# Patient Record
Sex: Female | Born: 1990 | Race: Black or African American | Hispanic: No | Marital: Single | Smoking: Current every day smoker
Health system: Southern US, Community
[De-identification: ages and names within clinical notes are randomized; demographics above are authoritative.]

## PROBLEM LIST (undated history)

## (undated) DIAGNOSIS — J45909 Unspecified asthma, uncomplicated: Secondary | ICD-10-CM

---
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
## 2008-05-13 IMAGING — CT CT ABDOMEN W/O CM
2 of 4 series · 13 of 32 positions shown, 18 images · IV contrast (agent unspecified)
Comparison: None.

CLINICAL DATA: Vomiting with back pain.  Question kidney stone. 
ABDOMEN CT WITHOUT CONTRAST:
TECHNIQUE: Multidetector CT imaging of the abdomen was performed following the standard protocol without IV contrast.
TECHNIQUE: Multidetector CT imaging of the pelvis was performed following the standard protocol without IV contrast.

[Series 2: routine abdomen · axial · 0.70mm/px · z∈[-365,-85]mm · 5 of 84 slices shown, 10 images]
[im 14/84  soft-tissue]
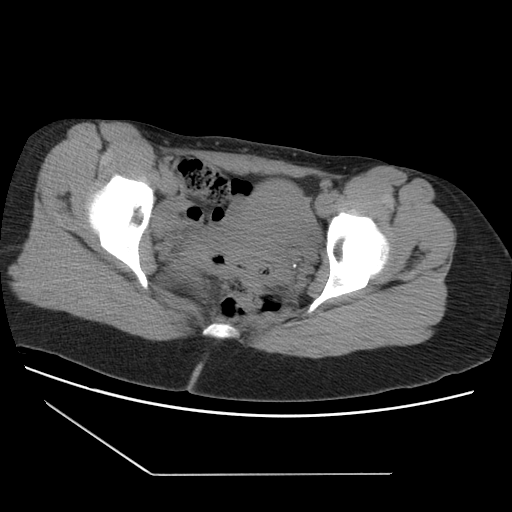
[im 14/84  bone]
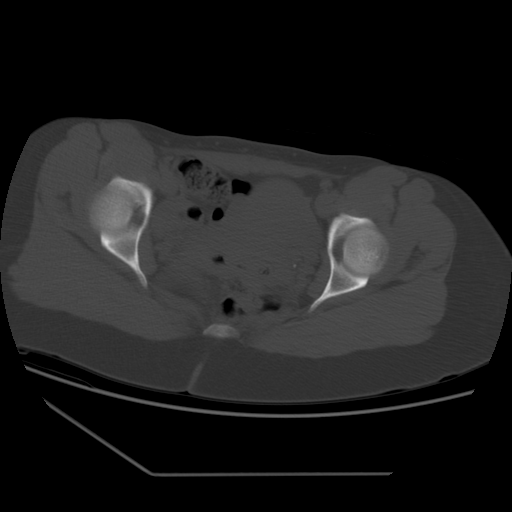
[im 28/84  soft-tissue]
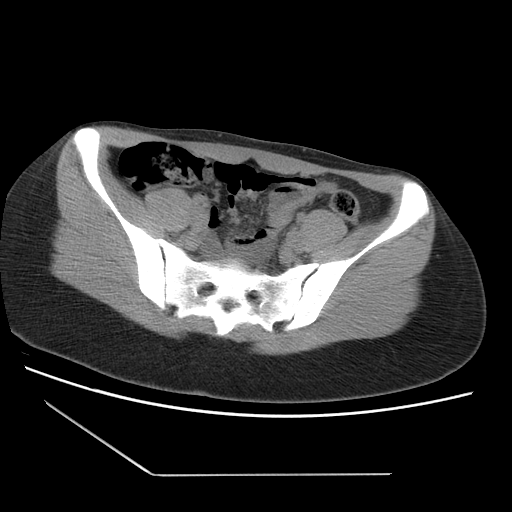
[im 28/84  lung]
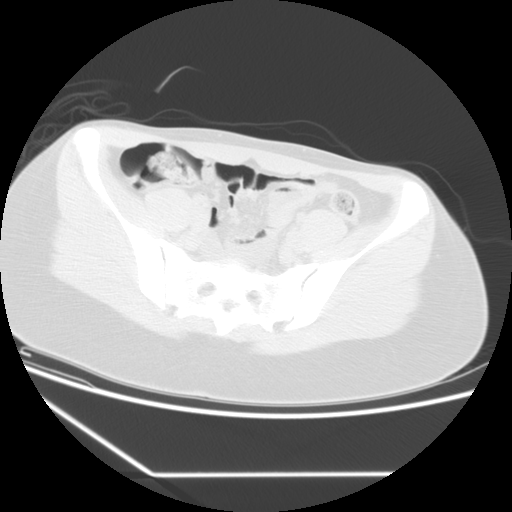
[im 42/84  soft-tissue]
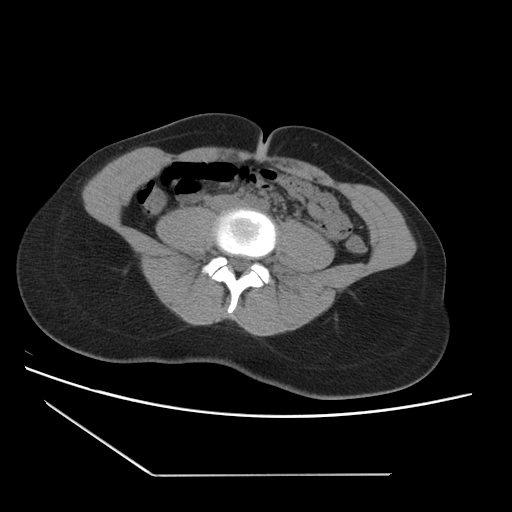
[im 42/84  lung]
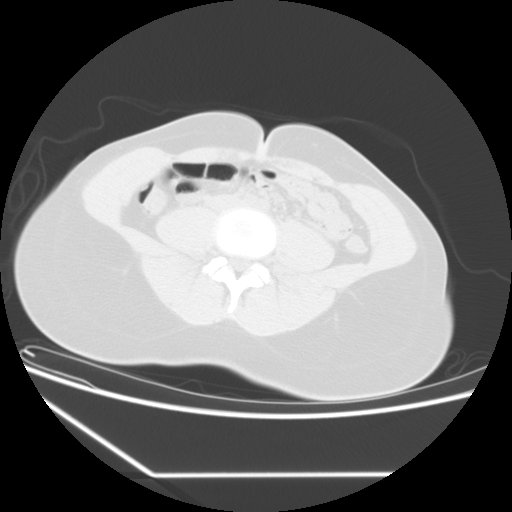
[im 56/84  soft-tissue]
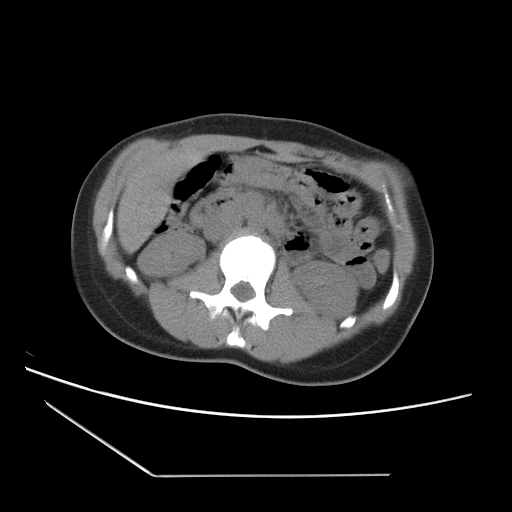
[im 56/84  lung]
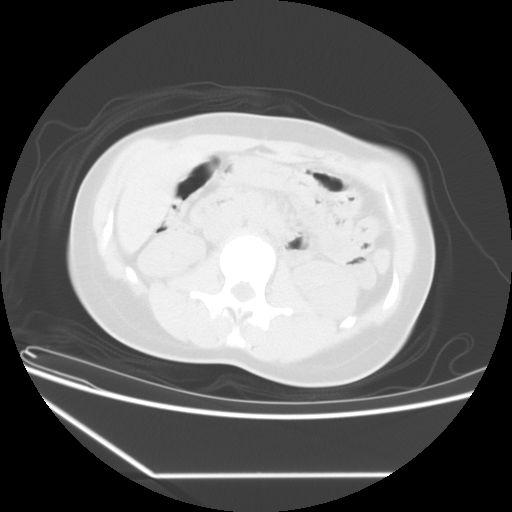
[im 70/84  soft-tissue]
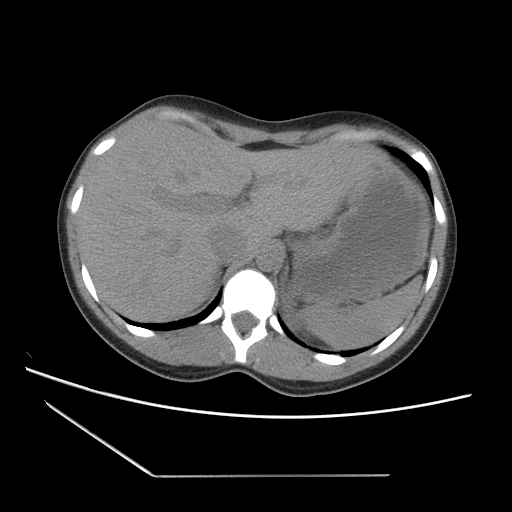
[im 70/84  lung]
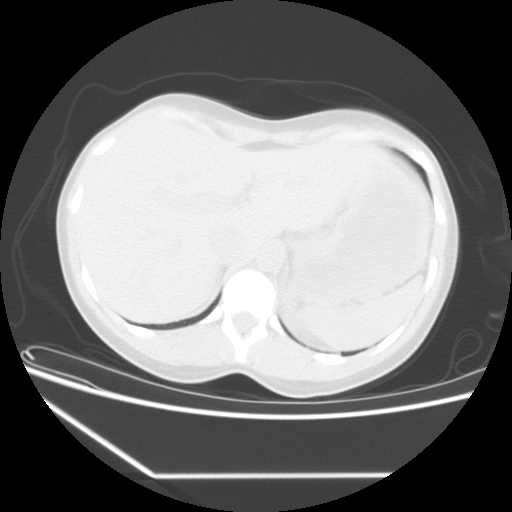

[Series 400: reformatted · sagittal · 0.88mm/px · 8 of 148 slices shown]
[im 13/148  soft-tissue]
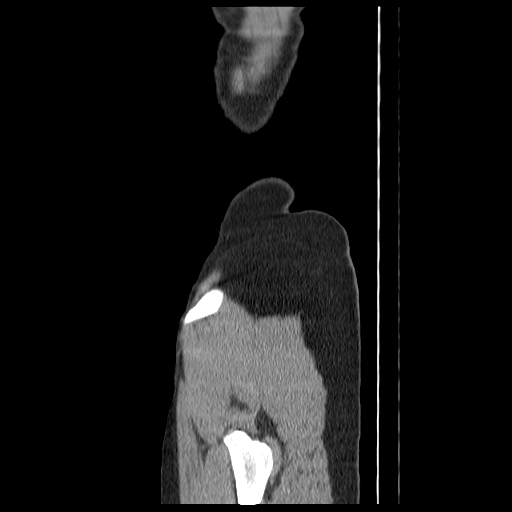
[im 37/148  soft-tissue]
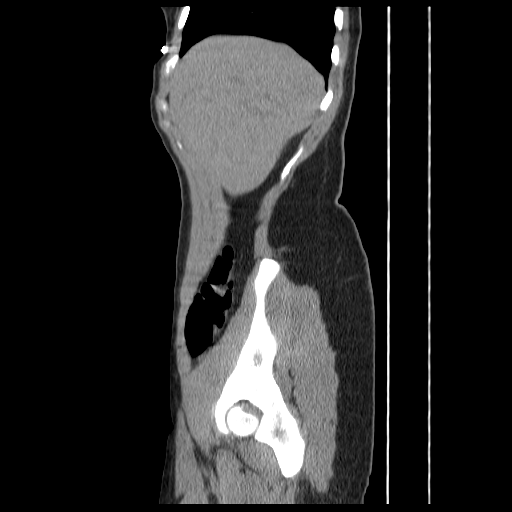
[im 50/148  soft-tissue]
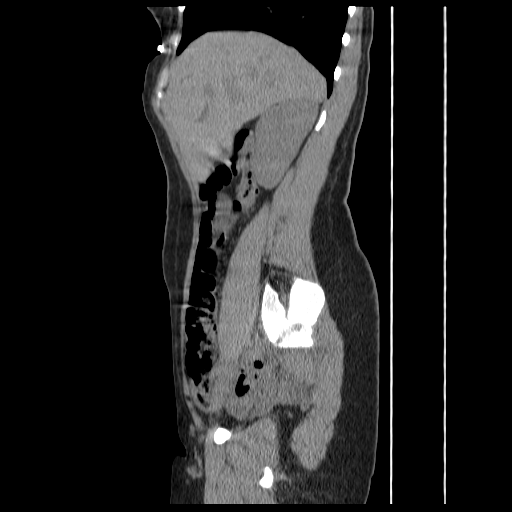
[im 62/148  soft-tissue]
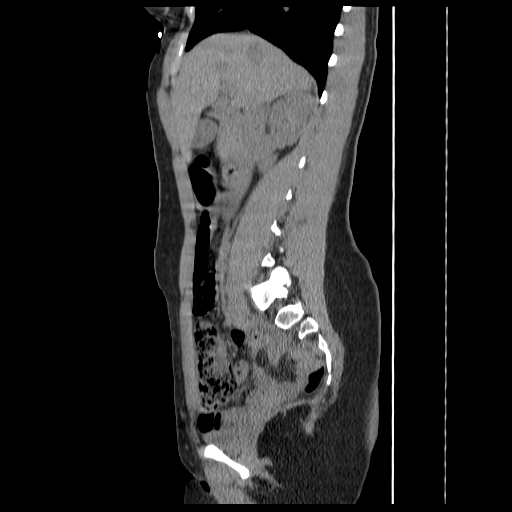
[im 86/148  soft-tissue]
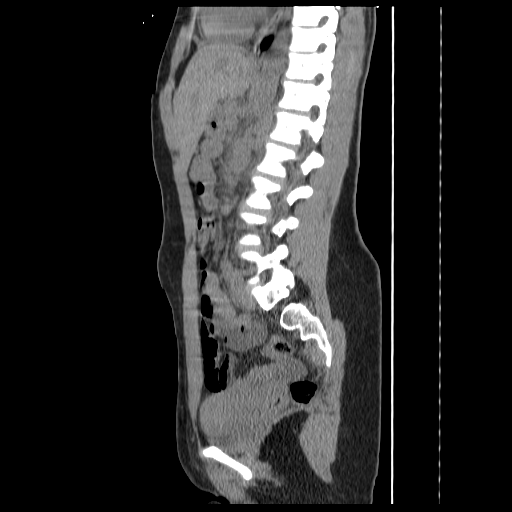
[im 99/148  soft-tissue]
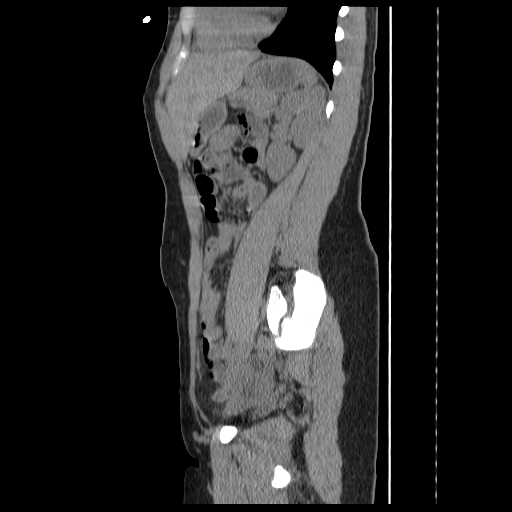
[im 111/148  soft-tissue]
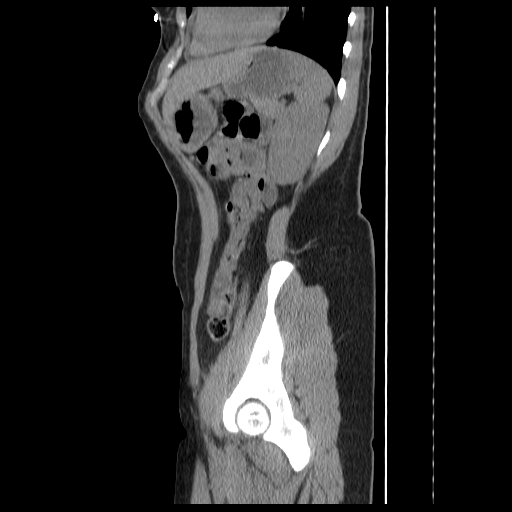
[im 135/148  soft-tissue]
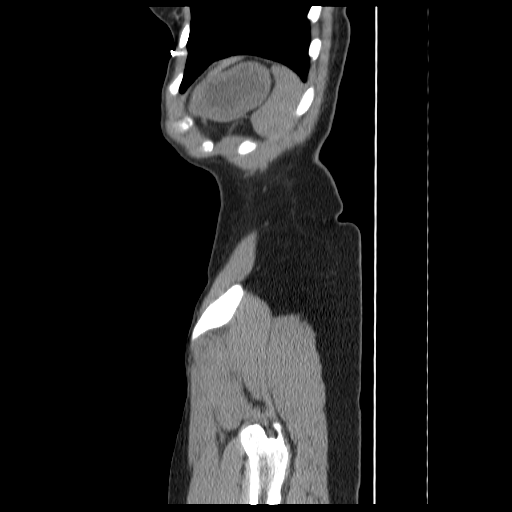

[13 of 32 positions shown; findings below may reference images not displayed]

FINDINGS: The lung bases are clear.  There is no pleural effusion.  There is a pectus deformity of the chest.  No renal or ureteral calculi are demonstrated.  There is no hydronephrosis.  As imaged in the unenhanced state, the liver, spleen, gallbladder, pancreas, adrenal glands, and kidneys appear unremarkable, but are not optimally evaluated due to limited intraabdominal fat and lack of contrast.
IMPRESSION: Negative for urinary tract calculus or hydronephrosis.  No acute abdominal findings are seen. 
PELVIS CT WITHOUT CONTRAST:
FINDINGS: Distally, the ureters are difficult to visualize.  No ureteral or bladder calculi are seen.  There are bilateral pelvic calcifications which are probably all phleboliths.  Air is noted within the vagina.  There is no adnexal mass.
IMPRESSION: No evidence of urinary tract calculus or hydronephrosis.  Air in the vagina ? correlate clinically.

## 2010-11-03 LAB — WET PREP, GENITAL
Trich, Wet Prep: NONE SEEN
Yeast Wet Prep HPF POC: NONE SEEN

## 2010-11-03 LAB — RAPID STREP SCREEN (MED CTR MEBANE ONLY): Streptococcus, Group A Screen (Direct): NEGATIVE

## 2010-11-03 LAB — GC/CHLAMYDIA PROBE AMP, GENITAL
Chlamydia, DNA Probe: POSITIVE — AB
GC Probe Amp, Genital: NEGATIVE

## 2010-11-03 LAB — POCT URINALYSIS DIP (DEVICE)
Glucose, UA: NEGATIVE
Hgb urine dipstick: NEGATIVE
Ketones, ur: 40 — AB
Nitrite: NEGATIVE
Operator id: 116391
Protein, ur: 30 — AB
Specific Gravity, Urine: 1.025
Urobilinogen, UA: 0.2
pH: 5.5

## 2010-11-03 LAB — POCT PREGNANCY, URINE
Operator id: 116391
Preg Test, Ur: POSITIVE

## 2010-11-24 LAB — I-STAT 8, (EC8 V) (CONVERTED LAB)
Chloride: 105
HCT: 46
Hemoglobin: 15.6
Potassium: 3.2 — ABNORMAL LOW
Sodium: 142
TCO2: 27
pH, Ven: 7.368 — ABNORMAL HIGH

## 2010-11-24 LAB — POCT I-STAT CREATININE
Creatinine, Ser: 0.8
Operator id: 151321

## 2010-11-24 LAB — URINALYSIS, ROUTINE W REFLEX MICROSCOPIC
Glucose, UA: NEGATIVE
Protein, ur: NEGATIVE
Urobilinogen, UA: 1

## 2010-11-24 LAB — URINE MICROSCOPIC-ADD ON

## 2010-11-24 LAB — DIFFERENTIAL
Basophils Absolute: 0.1
Basophils Relative: 1
Lymphocytes Relative: 30
Monocytes Absolute: 0.7
Monocytes Relative: 7
Neutro Abs: 6.5
Neutrophils Relative %: 61

## 2010-11-24 LAB — CBC
Hemoglobin: 13.7
RBC: 4.5
RDW: 12.9

## 2010-11-24 LAB — POCT PREGNANCY, URINE: Operator id: 151321

## 2017-04-22 ENCOUNTER — Encounter (HOSPITAL_COMMUNITY): Payer: Self-pay | Admitting: Emergency Medicine

## 2017-04-22 ENCOUNTER — Emergency Department (HOSPITAL_COMMUNITY)
Admission: EM | Admit: 2017-04-22 | Discharge: 2017-04-22 | Disposition: A | Discharge status: Home / Self Care | Payer: Medicaid Other | Attending: Emergency Medicine | Admitting: Emergency Medicine

## 2017-04-22 ENCOUNTER — Other Ambulatory Visit: Payer: Self-pay

## 2017-04-22 DIAGNOSIS — Y998 Other external cause status: Secondary | ICD-10-CM | POA: Insufficient documentation

## 2017-04-22 DIAGNOSIS — M25562 Pain in left knee: Secondary | ICD-10-CM | POA: Insufficient documentation

## 2017-04-22 DIAGNOSIS — Y9389 Activity, other specified: Secondary | ICD-10-CM | POA: Insufficient documentation

## 2017-04-22 DIAGNOSIS — F1210 Cannabis abuse, uncomplicated: Secondary | ICD-10-CM | POA: Insufficient documentation

## 2017-04-22 DIAGNOSIS — F17210 Nicotine dependence, cigarettes, uncomplicated: Secondary | ICD-10-CM | POA: Insufficient documentation

## 2017-04-22 DIAGNOSIS — J45909 Unspecified asthma, uncomplicated: Secondary | ICD-10-CM | POA: Diagnosis not present

## 2017-04-22 DIAGNOSIS — Y92410 Unspecified street and highway as the place of occurrence of the external cause: Secondary | ICD-10-CM | POA: Diagnosis not present

## 2017-04-22 DIAGNOSIS — R52 Pain, unspecified: Secondary | ICD-10-CM | POA: Diagnosis not present

## 2017-04-22 DIAGNOSIS — M549 Dorsalgia, unspecified: Secondary | ICD-10-CM | POA: Diagnosis not present

## 2017-04-22 DIAGNOSIS — Z5321 Procedure and treatment not carried out due to patient leaving prior to being seen by health care provider: Secondary | ICD-10-CM | POA: Diagnosis not present

## 2017-04-22 HISTORY — DX: Unspecified asthma, uncomplicated: J45.909

## 2017-04-22 MED ORDER — MELOXICAM 15 MG PO TABS: 15.0000 mg | ORAL_TABLET | Freq: Every day | ORAL | 0 refills | Status: AC

## 2017-04-22 MED ORDER — BACLOFEN 10 MG PO TABS: 10.0000 mg | ORAL_TABLET | Freq: Three times a day (TID) | ORAL | 0 refills | Status: AC

## 2017-04-22 NOTE — ED Notes (Signed)
Pt stable, ambulatory, and verbalizes understanding of d/c instructions.  

## 2017-04-22 NOTE — ED Notes (Signed)
Pt states that she is leaving due to wait times  
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
## 2017-04-22 NOTE — ED Notes (Signed)
Pt ambulatory and in NAD.

## 2017-04-22 NOTE — Discharge Instructions (Signed)
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
